# Patient Record
Sex: Male | Born: 1985 | Hispanic: Yes | Marital: Single | Smoking: Never smoker
Health system: Southern US, Community
[De-identification: ages and names within clinical notes are randomized; demographics above are authoritative.]

## PROBLEM LIST (undated history)

## (undated) HISTORY — PX: LEG SURGERY: SHX1003

---

## 2013-08-10 ENCOUNTER — Encounter (HOSPITAL_COMMUNITY): Payer: Self-pay | Admitting: Emergency Medicine

## 2013-08-10 ENCOUNTER — Emergency Department (HOSPITAL_COMMUNITY)
Admission: EM | Admit: 2013-08-10 | Discharge: 2013-08-10 | Disposition: A | Discharge status: Home / Self Care | Payer: Self-pay | Attending: Emergency Medicine | Admitting: Emergency Medicine

## 2013-08-10 DIAGNOSIS — Z79899 Other long term (current) drug therapy: Secondary | ICD-10-CM | POA: Insufficient documentation

## 2013-08-10 DIAGNOSIS — K219 Gastro-esophageal reflux disease without esophagitis: Secondary | ICD-10-CM

## 2013-08-10 DIAGNOSIS — R1013 Epigastric pain: Secondary | ICD-10-CM

## 2013-08-10 DIAGNOSIS — K3189 Other diseases of stomach and duodenum: Secondary | ICD-10-CM | POA: Insufficient documentation

## 2013-08-10 MED ORDER — GI COCKTAIL ~~LOC~~
30.0000 mL | Freq: Once | ORAL | Status: AC
  Administered 2013-08-10: 30 mL via ORAL
  Filled 2013-08-10: qty 30

## 2013-08-10 MED ORDER — PANTOPRAZOLE SODIUM 40 MG PO TBEC: 40.0000 mg | DELAYED_RELEASE_TABLET | Freq: Every day | ORAL | Status: DC

## 2013-08-10 NOTE — ED Notes (Signed)
Pt in c/o generalized abd pain x2 weeks, denies n/v/d or other symptoms, states his pain is increased at night and that he feels very full, no distress noted

## 2013-08-10 NOTE — ED Provider Notes (Signed)
CSN: [id number]     Arrival date & time 08/10/13  1729 History   First MD Initiated Contact with Patient 08/10/13 1801     Chief Complaint  Patient presents with  . Abdominal Pain   (Consider location/radiation/quality/duration/timing/severity/associated sxs/prior Treatment) HPI Comments: Pt reports pain gets worse at night when he tries to lay flat, has been sleeping propped up on pillows to ease the pain.  He admits he eats spicy things often, doesn't drink alcohol hardly.  No meds tried PTA.  Pt also reports over the past 6 months, he hasn't been able to hold his urine the whole night through, has to get up at 6 AM or so to urinate, doesn't seem to affect him during the day. 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    Patient is a 27 y.o. male presenting with abdominal pain. The history is provided by the patient.  Abdominal Pain Pain location:  Generalized Pain quality: burning and cramping   Pain radiates to:  Epigastric region and chest Pain severity:  Moderate Onset quality:  Gradual Duration:  2 weeks Timing:  Constant Progression:  Waxing and waning Chronicity:  New Context: not alcohol use, not previous surgeries, not recent travel, not retching, not sick contacts and not suspicious food intake   Worsened by:  Nothing tried Associated symptoms: no anorexia, no chills, no constipation, no diarrhea, no fever, no nausea, no shortness of breath and no vomiting     History reviewed. No pertinent past medical history. History reviewed. No pertinent past surgical history. History reviewed. No pertinent family history. History  Substance Use Topics  . Smoking status: Never Smoker   . Smokeless tobacco: Not on file  . Alcohol Use: Not on file    Review of Systems  Constitutional: Negative for fever, chills, activity change and appetite change.  HENT: Negative for trouble swallowing.   Respiratory: Negative for chest tightness and shortness of breath.    Gastrointestinal: Positive for abdominal pain. Negative for nausea, vomiting, diarrhea, constipation, blood in stool and anorexia.  Musculoskeletal: Negative for back pain.    Allergies  Review of patient's allergies indicates no known allergies.  Home Medications   Current Outpatient Rx  Name  Route  Sig  Dispense  Refill  . acetaminophen (TYLENOL) 500 MG tablet   Oral   Take 500 mg by mouth every 6 (six) hours as needed.         . pantoprazole (PROTONIX) 40 MG tablet   Oral   Take 1 tablet (40 mg total) by mouth daily.   14 tablet   0    BP 124/73  Pulse 63  Temp(Src) 98.2 F (36.8 C) (Oral)  Resp 20  Wt 149 lb (67.586 kg)  SpO2 100% Physical Exam  Nursing note and vitals reviewed. Constitutional: He is oriented to person, place, and time. He appears well-developed and well-nourished. No distress.  HENT:  Head: Normocephalic and atraumatic.  Eyes: Conjunctivae and EOM are normal. No scleral icterus.  Neck: Neck supple.  Cardiovascular: Normal rate and intact distal pulses.   Pulmonary/Chest: Effort normal. No respiratory distress.  Abdominal: Soft. Bowel sounds are normal. He exhibits no distension. There is no tenderness. There is no rebound and no guarding.  Neurological: He is alert and oriented to person, place, and time. He has normal strength. Gait normal.  Skin: Skin is warm and dry. No rash noted. He is not diaphoretic.    ED Course  Procedures (including critical  care time) Labs Review Labs Reviewed  URINALYSIS, ROUTINE W REFLEX MICROSCOPIC   Imaging Review No results found.  EKG Interpretation   None      ra sat is 100% and I interpret to be normal  8:10 PM Pt reports pain improved after GI cocktail.  Pt did not give a urine sample.  I doubt UTI, don't think UA is needed.  Will d/c home.   MDM   1. GERD (gastroesophageal reflux disease)   2. Dyspepsia    Pt by history suggestive of GERD and likely dyspepsia.  Non surgical abdomen on  exam.  Will give GI cocktail and Rx for protonix at home.  Will check UA.  I suspect pt is simply drinking more fluids in evening after work and thus has less ability to hold urine in the early AM . Pt is reassured about this.  No dysuria, testicular pain, denies discharge.      Gavin Pound. Oletta Lamas, MD 08/10/13 2010

## 2013-08-10 NOTE — ED Notes (Signed)
Pt st's he has had abd pair for about 2 weeks.  Pt denies nausea, vomiting or diarrhea.  Pt alert and oriented x's 3, skin warm and dry color appropriate.

## 2013-08-10 NOTE — Discharge Instructions (Signed)
Gastroesophageal Reflux Disease, Adult  Gastroesophageal reflux disease (GERD) happens when acid from your stomach flows up into the esophagus. When acid comes in contact with the esophagus, the acid causes soreness (inflammation) in the esophagus. Over time, GERD may create small holes (ulcers) in the lining of the esophagus.  CAUSES   · Increased body weight. This puts pressure on the stomach, making acid rise from the stomach into the esophagus.  · Smoking. This increases acid production in the stomach.  · Drinking alcohol. This causes decreased pressure in the lower esophageal sphincter (valve or ring of muscle between the esophagus and stomach), allowing acid from the stomach into the esophagus.  · Late evening meals and a full stomach. This increases pressure and acid production in the stomach.  · A malformed lower esophageal sphincter.  Sometimes, no cause is found.  SYMPTOMS   · Burning pain in the lower part of the mid-chest behind the breastbone and in the mid-stomach area. This may occur twice a week or more often.  · Trouble swallowing.  · Sore throat.  · Dry cough.  · Asthma-like symptoms including chest tightness, shortness of breath, or wheezing.  DIAGNOSIS   Your caregiver may be able to diagnose GERD based on your symptoms. In some cases, X-rays and other tests may be done to check for complications or to check the condition of your stomach and esophagus.  TREATMENT   Your caregiver may recommend over-the-counter or prescription medicines to help decrease acid production. Ask your caregiver before starting or adding any new medicines.   HOME CARE INSTRUCTIONS   · Change the factors that you can control. Ask your caregiver for guidance concerning weight loss, quitting smoking, and alcohol consumption.  · Avoid foods and drinks that make your symptoms worse, such as:  · Caffeine or alcoholic drinks.  · Chocolate.  · Peppermint or mint flavorings.  · Garlic and onions.  · Spicy foods.  · Citrus fruits,  such as oranges, lemons, or limes.  · Tomato-based foods such as sauce, chili, salsa, and pizza.  · Fried and fatty foods.  · Avoid lying down for the 3 hours prior to your bedtime or prior to taking a nap.  · Eat small, frequent meals instead of large meals.  · Wear loose-fitting clothing. Do not wear anything tight around your waist that causes pressure on your stomach.  · Raise the head of your bed 6 to 8 inches with wood blocks to help you sleep. Extra pillows will not help.  · Only take over-the-counter or prescription medicines for pain, discomfort, or fever as directed by your caregiver.  · Do not take aspirin, ibuprofen, or other nonsteroidal anti-inflammatory drugs (NSAIDs).  SEEK IMMEDIATE MEDICAL CARE IF:   · You have pain in your arms, neck, jaw, teeth, or back.  · Your pain increases or changes in intensity or duration.  · You develop nausea, vomiting, or sweating (diaphoresis).  · You develop shortness of breath, or you faint.  · Your vomit is green, yellow, black, or looks like coffee grounds or blood.  · Your stool is red, bloody, or black.  These symptoms could be signs of other problems, such as heart disease, gastric bleeding, or esophageal bleeding.  MAKE SURE YOU:   · Understand these instructions.  · Will watch your condition.  · Will get help right away if you are not doing well or get worse.  Document Released: 07/01/2005 Document Revised: 12/14/2011 Document Reviewed: 04/10/2011  [employer]® Patient   Information ©2014 [employer].

## 2015-07-11 ENCOUNTER — Ambulatory Visit (INDEPENDENT_AMBULATORY_CARE_PROVIDER_SITE_OTHER): Payer: Self-pay | Admitting: Family Medicine

## 2015-07-11 ENCOUNTER — Ambulatory Visit (INDEPENDENT_AMBULATORY_CARE_PROVIDER_SITE_OTHER): Payer: Self-pay

## 2015-07-11 ENCOUNTER — Encounter: Payer: Self-pay | Admitting: Family Medicine

## 2015-07-11 VITALS — BP 112/68 | HR 62 | Temp 98.4°F | Resp 18 | Ht 65.0 in | Wt 149.2 lb

## 2015-07-11 DIAGNOSIS — L729 Follicular cyst of the skin and subcutaneous tissue, unspecified: Secondary | ICD-10-CM

## 2015-07-11 NOTE — Patient Instructions (Signed)
We will arrange an ultrasound to make sure that the area of concern is just a cyst Assuming that this looks good I would not worry about it any longer If your ultrasound appt does not work for your schedule you can certainly call and reschedule for a better time

## 2015-07-11 NOTE — Progress Notes (Signed)
[facility] [address], [place] [address] [phone]  Date:  07/11/2015   Name:  Jeremiah Cain   DOB:  August 24, 1986   MRN:  [id number]  PCP:  No PCP Per Patient    Chief Complaint: Leg Injury   History of Present Illness:  Jeremiah Cain is a 29 y.o. very pleasant male patient who presents with the following:  Here today as a new patient.  He fell off a ladder about one year ago- landed on his right hip. He had a small bruise but it lasted a long time.  He did not have it looked at then- it seemed to be ok He is here today because he has noted a lump in the area ever since,  He has also noted that it hurts over the last few days.  Admits that his uncle recently died of cancer- he is not clear on the details of what he had, but in any case he got scared and came in He is otherwise generally in good health No other concerns today  There are no active problems to display for this patient.   History reviewed. No pertinent past medical history.  History reviewed. No pertinent past surgical history.  Social History  Substance Use Topics  . Smoking status: Never Smoker   . Smokeless tobacco: None  . Alcohol Use: None    History reviewed. No pertinent family history.  No Known Allergies  Medication list has been reviewed and updated.  Current Outpatient Prescriptions on File Prior to Visit  Medication Sig Dispense Refill  . acetaminophen (TYLENOL) 500 MG tablet Take 500 mg by mouth every 6 (six) hours as needed.    . pantoprazole (PROTONIX) 40 MG tablet Take 1 tablet (40 mg total) by mouth daily. (Patient not taking: Reported on 07/11/2015) 14 tablet 0   No current facility-administered medications on file prior to visit.    Review of Systems:  As per HPI- otherwise negative.   Physical Examination: Filed Vitals:   07/11/15 1404  BP: 112/68  Pulse: 62  Temp: 98.4 F (36.9 C)  Resp: 18   Filed Vitals:   07/11/15 1404  Height:   (1.651 m)  Weight: 149 lb 3.2 oz (67.677 kg)   Body mass index is 24.83 kg/(m^2). Ideal Body Weight: Weight in (lb) to have BMI = 25: 149.9  GEN: WDWN, NAD, Non-toxic, A & O x 3, looks well and healthy  HEENT: Atraumatic, Normocephalic. Neck supple. No masses, No LAD. Ears and Nose: No external deformity. CV: RRR, No M/G/R. No JVD. No thrill. No extra heart sounds. PULM: CTA B, no wheezes, crackles, rhonchi. No retractions. No resp. distress. No accessory muscle use. ABD: S, NT, ND. No rebound. No HSM. EXTR: No c/c/e NEURO Normal gait.  PSYCH: Normally interactive. Conversant. Not depressed or anxious appearing.  Calm demeanor.   He has what seems to be a mobile, smooth subcue cyst in the external right hip near the greater trochanter , approx 2cm in diameter.  Normal ROM of the hip, no tenderness with ROM  [facility] reading (PRIMARY) by  Dr. Patsy Lager. Right hip: negative  EXAM: DG HIP (WITH OR WITHOUT PELVIS) 2-3V RIGHT  COMPARISON: None impacts  FINDINGS: The bony pelvis is adequately mineralized. There is no evidence of an acute or old fracture. AP and frog-leg lateral views of the right hip reveal the joint space be reasonably well maintained. The femoral head, neck, and intertrochanteric regions are normal.  The soft tissues are unremarkable.  IMPRESSION: There is no acute or significant chronic bony abnormality of the pelvis or right hip.   Assessment and Plan: Cyst of subcutaneous tissue - Plan: DG HIP UNILAT W OR W/O PELVIS 2-3 VIEWS RIGHT, [place] Misc Soft Tissue  He felt reassured by x-ray but would like to go ahead and an [place] which is fine- will arrange this for him Suspect a benign cyst  Signed Abbe Amsterdam, MD

## 2015-07-19 ENCOUNTER — Other Ambulatory Visit: Payer: Self-pay | Admitting: Family Medicine

## 2015-07-19 ENCOUNTER — Ambulatory Visit
Admission: RE | Admit: 2015-07-19 | Discharge: 2015-07-19 | Disposition: A | Discharge status: Home / Self Care | Payer: No Typology Code available for payment source | Source: Ambulatory Visit | Attending: Family Medicine | Admitting: Family Medicine

## 2015-07-19 DIAGNOSIS — L729 Follicular cyst of the skin and subcutaneous tissue, unspecified: Secondary | ICD-10-CM

## 2015-07-23 ENCOUNTER — Telehealth: Payer: Self-pay

## 2015-07-23 DIAGNOSIS — R2241 Localized swelling, mass and lump, right lower limb: Secondary | ICD-10-CM

## 2015-07-23 NOTE — Telephone Encounter (Signed)
Dr. Patsy Lageropland  Patient is waiting a call back regarding his test.   [phone] (H)

## 2015-07-23 NOTE — Telephone Encounter (Signed)
Received his US report  US PELVIS LIMITED  TECHNIQUE: Ultrasound examination of the pelvic soft tissues was performed in the area of clinical concern.  COMPARISON: None.  FINDINGS: A solid-appearing lesion is seen deep to the skin surface, at the site of palpable abnormality in the right lateral superior thigh, measuring 2.5 x 0.9 x 1.8 cm. No internal blood flow. No cystic component.  IMPRESSION: Solid appearing lesion in the right lateral superior thigh, at the site of palpable abnormality  Called him back- while I still think this lesion is probably benign, it is not a cyst and we really don't know what it is.  He would like to see general surgery for their opinion which is reasonable.  I will make this referral for him- he will let us know if he does not hear about this soon

## 2015-08-05 ENCOUNTER — Ambulatory Visit: Payer: Self-pay | Admitting: Surgery

## 2015-08-05 NOTE — H&P (Signed)
  History of Present Illness Jeremiah Cain(Casmere Hollenbeck K. Otillia Cordone MD; 08/05/2015 10:12 AM) Patient words: thigh mass.  The patient is a 29 year old male who presents with a complaint of Mass. Referred by dr. 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 Several months ago he had an accident where he fell off of the latter and landed on his right buttock. He had a bruise in this area that persisted for several months. He has developed a firm mass in this area that has become slightly larger but certainly causes some significant discomfort. He underwent an ultrasound of this area.  CLINICAL DATA: Palpable area right lateral thigh with pain radiating down the leg for 2 weeks.  EXAM: US PELVIS LIMITED  TECHNIQUE: Ultrasound examination of the pelvic soft tissues was performed in the area of clinical concern.  COMPARISON: None.  FINDINGS: A solid-appearing lesion is seen deep to the skin surface, at the site of palpable abnormality in the right lateral superior thigh, measuring 2.5 x 0.9 x 1.8 cm. No internal blood flow. No cystic component.  IMPRESSION: Solid appearing lesion in the right lateral superior thigh, at the site of palpable abnormality.   Electronically Signed By: Leanna BattlesMelinda Blietz M.D. On: 07/19/2015 15:03  Due to the discomfort and the recent passing of his uncle from cancer, he would like to have this area excised.     Other Problems (Ammie Eversole, LPN; 16/10/960410/31/2016 9:56 AM) Gastroesophageal Reflux Disease  Past Surgical History (Ammie Eversole, LPN; 54/09/811910/31/2016 9:56 AM) No pertinent past surgical history  Allergies (Ammie Eversole, LPN; 14/78/295610/31/2016 9:57 AM) No Known Drug Allergies 08/05/2015  Medication History (Ammie Eversole, LPN; 21/30/865710/31/2016 9:57 AM) Tylenol (325MG  Tablet, Oral) Active. Medications Reconciled     Review of Systems (Ammie Eversole LPN; 84/69/629510/31/2016 9:56 AM) General Present-  Fatigue. Not Present- Appetite Loss, Chills, Fever, Night Sweats, Weight Gain and Weight Loss. Skin Present- New Lesions. Not Present- Change in Wart/Mole, Dryness, Hives, Jaundice, Non-Healing Wounds, Rash and Ulcer. Musculoskeletal Present- Muscle Weakness. Not Present- Back Pain, Joint Pain, Joint Stiffness, Muscle Pain and Swelling of Extremities.  Vitals (Ammie Eversole LPN; 28/41/324410/31/2016 9:57 AM) 08/05/2015 9:57 AM Weight: 154.2 lb Height: 65in Body Surface Area: 1.77 m Body Mass Index: 25.66 kg/m  Temp.: 97.28F(Oral)  Pulse: 72 (Regular)  BP: 110/72 (Sitting, Left Arm, Standard)      Physical Exam Molli Hazard(Mikala Podoll K. Morna Flud MD; 08/05/2015 10:12 AM)  The physical exam findings are as follows: Note:WDWN in NAD HEENT: EOMI, sclera anicteric Neck: No masses, no thyromegaly Lungs: CTA bilaterally; normal respiratory effort CV: Regular rate and rhythm; no murmurs Abd: +bowel sounds, soft, non-tender, no masses Right lateral buttock - no skin discoloration or changes; 2 cm firm subcutaneous mass; smooth; not mobile Ext: Well-perfused; no edema Skin: Warm, dry; no sign of jaundice    Assessment & Plan Molli Hazard(Mekiyah Gladwell K. Esiquio Boesen MD; 08/05/2015 10:07 AM)  MASS OF SUBCUTANEOUS TISSUE (R22.9) Story: 2 cm; right buttock; tender  Current Plans Schedule for Surgery - Excision of subcutaneous mass - right buttock - 2 cm; The surgical procedure has been discussed with the patient. Potential risks, benefits, alternative treatments, and expected outcomes have been explained. All of the patient's questions at this time have been answered. The likelihood of reaching the patient's treatment goal is good. The patient understand the proposed surgical procedure and wishes to proceed  Jeremiah ArmsMatthew K. Corliss Skainssuei, MD, [facility]  General/ Trauma Surgery  08/05/2015 10:13 AM

## 2017-02-08 ENCOUNTER — Encounter (HOSPITAL_COMMUNITY): Payer: Self-pay | Admitting: Vascular Surgery

## 2017-02-08 ENCOUNTER — Emergency Department (HOSPITAL_COMMUNITY)
Admission: EM | Admit: 2017-02-08 | Discharge: 2017-02-09 | Disposition: A | Discharge status: Home / Self Care | Payer: Self-pay | Attending: Emergency Medicine | Admitting: Emergency Medicine

## 2017-02-08 DIAGNOSIS — H9202 Otalgia, left ear: Secondary | ICD-10-CM | POA: Insufficient documentation

## 2017-02-08 MED ORDER — IBUPROFEN 600 MG PO TABS: 600.0000 mg | ORAL_TABLET | Freq: Three times a day (TID) | ORAL | 0 refills | Status: DC | PRN

## 2017-02-08 MED ORDER — OFLOXACIN 0.3 % OP SOLN
5.0000 [drp] | Freq: Once | OPHTHALMIC | Status: AC
  Administered 2017-02-09: 5 [drp] via OTIC
  Filled 2017-02-08: qty 5

## 2017-02-08 NOTE — ED Notes (Signed)
See provider assessment 

## 2017-02-08 NOTE — ED Provider Notes (Signed)
[facility]-EMERGENCY DEPT Provider Note   CSN: [id number] Arrival date & time: 02/08/17  2032 By signing my name below, I, Bridgette Habermann, attest that this documentation has been prepared under the direction and in the presence of Felicie Morn, FNP. Electronically Signed: Bridgette Habermann, ED Scribe. 02/08/17. 11:30 PM.  History   Chief Complaint Chief Complaint  Patient presents with  . Otalgia    HPI The history is provided by the patient. No language interpreter was used.   HPI Comments: Jeremiah Cain is a 31 y.o. male with no pertinent PMHx, who presents to the Emergency Department complaining of left ear pain beginning last night with associated mild sore throat. Pt states pain is aching in quality and rates it an 8/10. No recent injury or trauma but notes he has been outside more recently. He has not tried any OTC medications PTA. Pt denies fever, chills, nausea, vomiting, nasal congestion, rhinorrhea, cough, hearing loss, or drainage from the ear.  History reviewed. No pertinent past medical history.  There are no active problems to display for this patient.   Past Surgical History:  Procedure Laterality Date  . LEG SURGERY Right        Home Medications    Prior to Admission medications   Medication Sig Start Date End Date Taking? Authorizing Provider  acetaminophen (TYLENOL) 500 MG tablet Take 500 mg by mouth every 6 (six) hours as needed.    [provider]  pantoprazole (PROTONIX) 40 MG tablet Take 1 tablet (40 mg total) by mouth daily. Patient not taking: Reported on 07/11/2015 08/10/13   Quita Skye, MD    Family History History reviewed. No pertinent family history.  Social History Social History  Substance Use Topics  . Smoking status: Never Smoker  . Smokeless tobacco: Never Used  . Alcohol use Yes     Comment: occasionally     Allergies   Patient has no known allergies.   Review of Systems Review of Systems  Constitutional: Negative for chills and  fever.  HENT: Positive for ear pain and sore throat. Negative for congestion, ear discharge, hearing loss and rhinorrhea.   Respiratory: Negative for cough.   Gastrointestinal: Negative for nausea and vomiting.  All other systems reviewed and are negative.  Physical Exam Updated Vital Signs BP 128/85 (BP Location: Left Arm)   Pulse 73   Temp 98.7 F (37.1 C) (Oral)   Resp 16   SpO2 97%   Physical Exam  Constitutional: He appears well-developed and well-nourished.  HENT:  Head: Normocephalic.  Right Ear: Hearing, tympanic membrane and external ear normal.  Left Ear: Hearing and external ear normal.  Left tympanic erythematous with small hole noted, no associated hearing loss and no known trauma.  Eyes: Conjunctivae are normal.  Cardiovascular: Normal rate.   Pulmonary/Chest: Effort normal. No respiratory distress.  Abdominal: He exhibits no distension.  Musculoskeletal: Normal range of motion.  Neurological: He is alert.  Skin: Skin is warm and dry.  Psychiatric: He has a normal mood and affect. His behavior is normal.  Nursing note and vitals reviewed.    ED Treatments / Results  DIAGNOSTIC STUDIES: Oxygen Saturation is 97% on RA, adequate by my interpretation.   COORDINATION OF CARE: 11:30 PM-Discussed next steps with pt. Pt verbalized understanding and is agreeable with the plan.   Labs (all labs ordered are listed, but only abnormal results are displayed) Labs Reviewed - No data to display  EKG  EKG Interpretation None  Radiology No results found.  Procedures Procedures (including critical care time)  Medications Ordered in ED Medications - No data to display   Initial Impression / Assessment and Plan / ED Course  I have reviewed the triage vital signs and the nursing notes.  Pertinent labs & imaging results that were available during my care of the patient were reviewed by me and considered in my medical decision making (see chart for  details).     Pt presenting with otalgia. Pt afebrile in NAD. Exam not concerning for mastoiditis, cellulitis. Discharge with ocuflox.  Advised follow up with ENT in 2-3 days if no improvement.  Return precautions discussed. Pt appears safe for discharge.  Final Clinical Impressions(s) / ED Diagnoses   Final diagnoses:  Otalgia of left ear    New Prescriptions New Prescriptions   No medications on file   I personally performed the services described in this documentation, which was scribed in my presence. The recorded information has been reviewed and is accurate.     Felicie MornSmith, Joyous Gleghorn, NP 02/09/17 0149    Marily MemosMesner, Jason, MD 02/09/17 [phone]

## 2017-02-08 NOTE — Discharge Instructions (Signed)
Apply five drops of the ocuflox to the left ear two times per day.

## 2017-02-08 NOTE — ED Triage Notes (Signed)
Pt reports to the ED for eval of left earache x several days. Denies any drainage from the ear. Pt denies any N/V. He has been outside a lot more recently. Denies any nasal congestion/drainage or sore throat.

## 2017-02-09 NOTE — ED Notes (Signed)
Waiting on medication from main pharmacy 

## 2017-05-01 IMAGING — CR DG HIP (WITH OR WITHOUT PELVIS) 2-3V*R*
2 series · 2 of 2 positions shown · non-contrast
Comparison: None impacts

CLINICAL DATA: Status post fall from a ladder 1 year ago landing on
the right hip, initial bruising over the hip but now has developed a
lump in this region which has become painful over the past few days.

EXAM:
DG HIP (WITH OR WITHOUT PELVIS) 2-3V RIGHT

[AP]
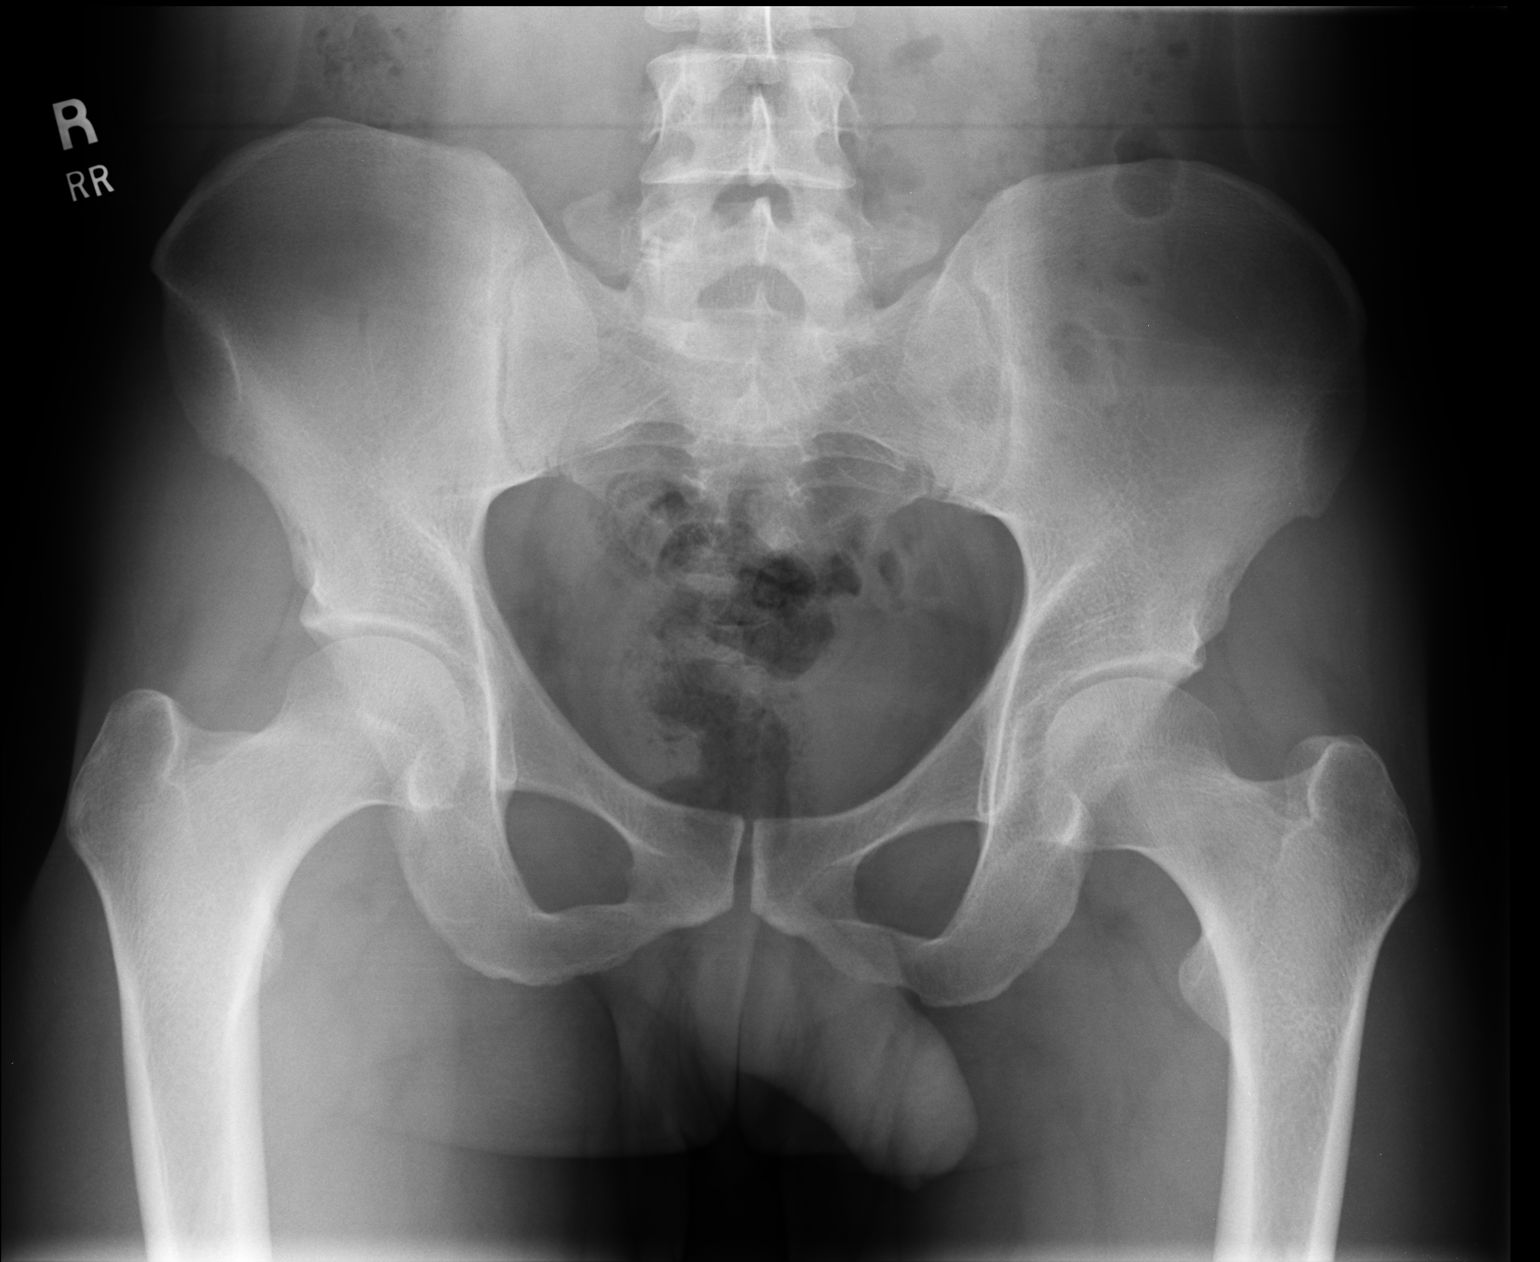

[lateral]
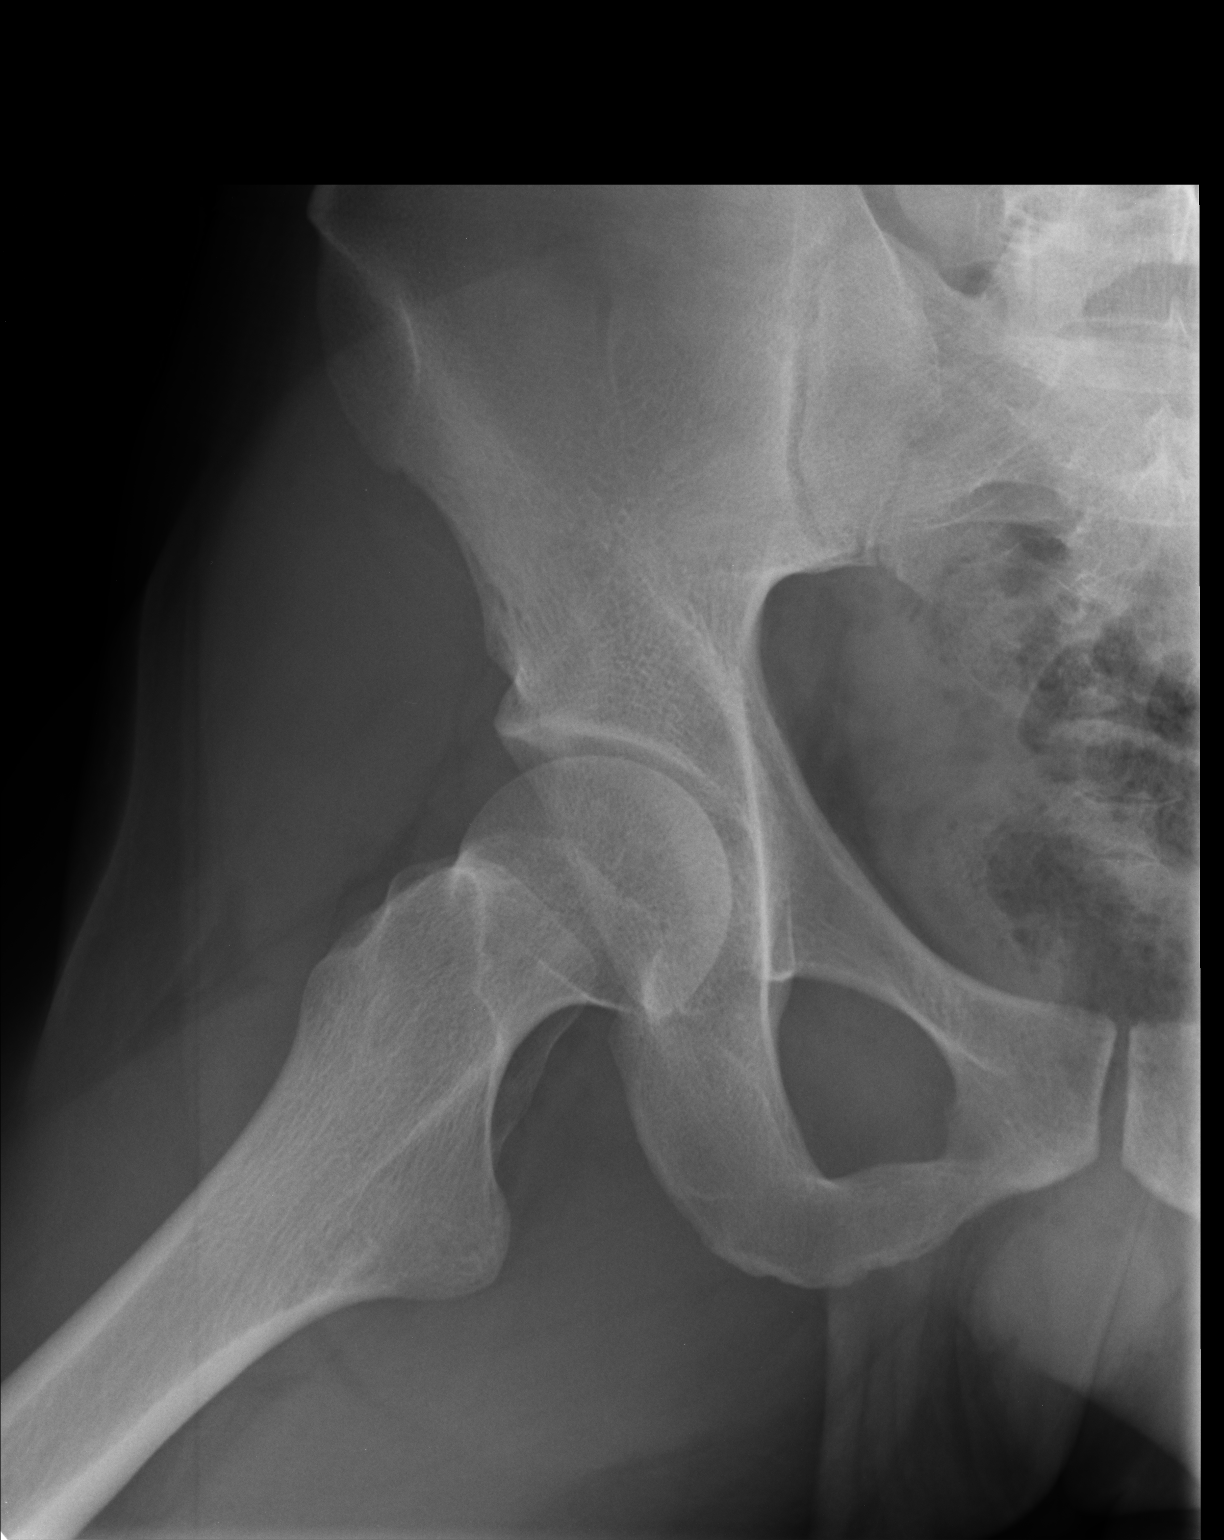

[2 of 2 positions shown; findings below may reference images not displayed]

FINDINGS: The bony pelvis is adequately mineralized. There is no evidence of
an acute or old fracture. AP and frog-leg lateral views of the right
hip reveal the joint space be reasonably well maintained. The
femoral head, neck, and intertrochanteric regions are normal. The
soft tissues are unremarkable.
IMPRESSION: There is no acute or significant chronic bony abnormality of the
pelvis or right hip.

## 2019-05-11 ENCOUNTER — Other Ambulatory Visit: Payer: Self-pay

## 2019-05-11 ENCOUNTER — Encounter (HOSPITAL_COMMUNITY): Payer: Self-pay | Admitting: Emergency Medicine

## 2019-05-11 ENCOUNTER — Ambulatory Visit (HOSPITAL_COMMUNITY)
Admission: EM | Admit: 2019-05-11 | Discharge: 2019-05-11 | Disposition: A | Discharge status: Home / Self Care | Payer: Self-pay | Attending: Urgent Care | Admitting: Urgent Care

## 2019-05-11 DIAGNOSIS — S0181XA Laceration without foreign body of other part of head, initial encounter: Secondary | ICD-10-CM

## 2019-05-11 DIAGNOSIS — S0990XA Unspecified injury of head, initial encounter: Secondary | ICD-10-CM

## 2019-05-11 DIAGNOSIS — R51 Headache: Secondary | ICD-10-CM

## 2019-05-11 DIAGNOSIS — R519 Headache, unspecified: Secondary | ICD-10-CM

## 2019-05-11 DIAGNOSIS — W268XXA Contact with other sharp object(s), not elsewhere classified, initial encounter: Secondary | ICD-10-CM

## 2019-05-11 DIAGNOSIS — Y9259 Other trade areas as the place of occurrence of the external cause: Secondary | ICD-10-CM

## 2019-05-11 DIAGNOSIS — Z23 Encounter for immunization: Secondary | ICD-10-CM

## 2019-05-11 MED ORDER — LIDOCAINE-EPINEPHRINE (PF) 2 %-1:200000 IJ SOLN
INTRAMUSCULAR | Status: AC
  Filled 2019-05-11: qty 20

## 2019-05-11 MED ORDER — NAPROXEN 500 MG PO TABS: 500.0000 mg | ORAL_TABLET | Freq: Two times a day (BID) | ORAL | 0 refills | Status: DC

## 2019-05-11 MED ORDER — TRAMADOL HCL 50 MG PO TABS: 50.0000 mg | ORAL_TABLET | Freq: Four times a day (QID) | ORAL | 0 refills | Status: DC | PRN

## 2019-05-11 MED ORDER — TETANUS-DIPHTH-ACELL PERTUSSIS 5-2.5-18.5 LF-MCG/0.5 IM SUSP
0.5000 mL | Freq: Once | INTRAMUSCULAR | Status: AC
  Administered 2019-05-11: 14:00:00 0.5 mL via INTRAMUSCULAR

## 2019-05-11 MED ORDER — TETANUS-DIPHTH-ACELL PERTUSSIS 5-2.5-18.5 LF-MCG/0.5 IM SUSP
INTRAMUSCULAR | Status: AC
  Filled 2019-05-11: qty 0.5

## 2019-05-11 MED ORDER — CEPHALEXIN 500 MG PO CAPS: 500.0000 mg | ORAL_CAPSULE | Freq: Three times a day (TID) | ORAL | 0 refills | Status: DC

## 2019-05-11 NOTE — ED Provider Notes (Addendum)
MRN: [id number] DOB: May 28, 1986  Subjective:   Arinze Rivadeneira is a 33 y.o. male presenting for suffering an acute laceration to his forehead today while at work. 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 He did not fall, have loc; no dizziness, confusion, vision changes. He has headache/pain about his wound. Cannot recall his last tdap.    No current facility-administered medications for this encounter.   Current Outpatient Medications:  .  acetaminophen (TYLENOL) 500 MG tablet, Take 500 mg by mouth every 6 (six) hours as needed., Disp: , Rfl:  .  ibuprofen (ADVIL,MOTRIN) 600 MG tablet, Take 1 tablet (600 mg total) by mouth every 8 (eight) hours as needed., Disp: 15 tablet, Rfl: 0 .  pantoprazole (PROTONIX) 40 MG tablet, Take 1 tablet (40 mg total) by mouth daily. (Patient not taking: Reported on 07/11/2015), Disp: 14 tablet, Rfl: 0    No Known Allergies   History reviewed. No pertinent past medical history.    Past Surgical History:  Procedure Laterality Date  . LEG SURGERY Right     ROS  Objective:   Vitals: BP 116/85   Pulse 60   Temp 97.9 F (36.6 C)   Resp 16   SpO2 100%   Physical Exam Constitutional:      General: He is not in acute distress.    Appearance: Normal appearance. He is well-developed. He is not ill-appearing, toxic-appearing or diaphoretic.  HENT:     Head: Normocephalic and atraumatic.      Right Ear: External ear normal.     Left Ear: External ear normal.     Nose: Nose normal.     Mouth/Throat:     Mouth: Mucous membranes are moist.     Pharynx: Oropharynx is clear.  Eyes:     General: No scleral icterus.    Extraocular Movements: Extraocular movements intact.     Pupils: Pupils are equal, round, and reactive to light.  Cardiovascular:     Rate and Rhythm: Normal rate and regular rhythm.     Heart sounds: Normal heart sounds. No murmur. No friction rub. No gallop.    Pulmonary:     Effort: Pulmonary effort is normal. No respiratory distress.     Breath sounds: Normal breath sounds. No stridor. No wheezing, rhonchi or rales.  Neurological:     General: No focal deficit present.     Mental Status: He is alert and oriented to person, place, and time.     Cranial Nerves: No cranial nerve deficit.     Motor: No weakness.     Coordination: Coordination normal.     Gait: Gait normal.     Deep Tendon Reflexes: Reflexes normal.  Psychiatric:        Mood and Affect: Mood normal.        Behavior: Behavior normal.        Thought Content: Thought content normal.        Judgment: Judgment normal.           PROCEDURE NOTE: laceration repair Verbal consent obtained from patient.  Laceration extends approximately 5 cm in total.  Local anesthesia with 8cc Lidocaine 2% with epinephrine.  Wound explored for tendon, ligament damage. Wound scrubbed with soap and water and rinsed. Wound closed with #7 5-0 Prolene and 5-0 Ethilon (combination of horizontal mattress, subcutaneous dog ear and simple interrupted) sutures.  Wound cleansed and dressed.   Assessment and Plan :  1. Injury of head, initial encounter   2. Facial laceration, initial encounter   3. Facial pain     Laceration repaired successfully. Wound care reviewed. Counseled on signs and symptoms warranting head CT for possible skull fracture, concussion although I have low suspicion of either. Return-to-clinic precautions discussed, patient verbalized understanding. Otherwise, follow up in 7 days for suture removal.    Wallis BambergMani, Aubreana Cornacchia, PA-C 05/11/19 1411    Wallis BambergMani, Jacqualine Weichel, PA-C 05/25/19 1051

## 2019-05-11 NOTE — ED Triage Notes (Signed)
Pt states he was hit in the head today with a metal frame, denies LOC. Laceration is bandaged up at this time, unable to assess.

## 2019-05-11 NOTE — ED Notes (Signed)
Patient reports a spring on equipment snapped and was hit in the forehead.  Denies loc.  Patient has a laceration to left forehead.  Continued bleeding, slow flow.  Applied dressing to forehead.  Notified traci, np about patient .  Will see patient

## 2023-03-16 ENCOUNTER — Other Ambulatory Visit: Payer: Self-pay

## 2023-03-16 ENCOUNTER — Encounter (HOSPITAL_COMMUNITY): Payer: Self-pay | Admitting: Emergency Medicine

## 2023-03-16 ENCOUNTER — Ambulatory Visit (HOSPITAL_COMMUNITY)
Admission: EM | Admit: 2023-03-16 | Discharge: 2023-03-16 | Disposition: A | Discharge status: Home / Self Care | Payer: Self-pay | Attending: Emergency Medicine | Admitting: Emergency Medicine

## 2023-03-16 DIAGNOSIS — R102 Pelvic and perineal pain: Secondary | ICD-10-CM | POA: Insufficient documentation

## 2023-03-16 DIAGNOSIS — M545 Low back pain, unspecified: Secondary | ICD-10-CM | POA: Insufficient documentation

## 2023-03-16 LAB — POCT URINALYSIS DIP (MANUAL ENTRY)
Bilirubin, UA: NEGATIVE
Glucose, UA: NEGATIVE mg/dL
Ketones, POC UA: NEGATIVE mg/dL
Leukocytes, UA: NEGATIVE
Nitrite, UA: NEGATIVE
Protein Ur, POC: NEGATIVE mg/dL
Spec Grav, UA: 1.01
Urobilinogen, UA: 0.2 U/dL
pH, UA: 7

## 2023-03-16 MED ORDER — CIPROFLOXACIN HCL 500 MG PO TABS: 500.0000 mg | ORAL_TABLET | Freq: Two times a day (BID) | ORAL | 0 refills | Status: DC

## 2023-03-16 NOTE — ED Triage Notes (Signed)
Reports abdominal pain and lower back pain for 4 days.  No history of this type of pain.  Saturday morning was vomiting, but none since then.  No issues with urination.  No diarrhea.    Patient took advil one time

## 2023-03-16 NOTE — ED Provider Notes (Signed)
[facility]    CSN: [id number] Arrival date & time: 03/16/23  1232      History   Chief Complaint Chief Complaint  Patient presents with   Abdominal Pain    HPI Jeremiah Cain is a 37 y.o. male.   Patient presents for evaluation of centralized lower back pain and centralized lower abdominal pain present for 4 days.  Pain does not radiate and still describes 2 separate pains that are occurring intermittently.  Pain today) described like, but has not had a sensation of being swelling.  Given that pain is constant again and exacerbated when lying flat.  Had 1 occurrence of vomiting 3 days ago, has resolved, has been able to tolerate both food and liquids since. has had several occurrence of urinary.. He appears to be has not attempted treatment.  Denies dysuria, hematuria, frequency, fevers, hematuria, penile discharge, testicular swelling, diarrhea, constipation, heartburn or indigestion.  Denies dietary changes or recent travel.  No other member of household has similar symptoms.  History reviewed. No pertinent past medical history.  There are no problems to display for this patient.   Past Surgical History:  Procedure Laterality Date   LEG SURGERY Right        Home Medications    Prior to Admission medications   Medication Sig Start Date End Date Taking? Authorizing Provider  acetaminophen (TYLENOL) 500 MG tablet Take 500 mg by mouth every 6 (six) hours as needed.    [provider]  cephALEXin (KEFLEX) 500 MG capsule Take 1 capsule (500 mg total) by mouth 3 (three) times daily. Patient not taking: Reported on 03/16/2023 05/11/19   Wallis Bamberg, PA-C  ibuprofen (ADVIL,MOTRIN) 600 MG tablet Take 1 tablet (600 mg total) by mouth every 8 (eight) hours as needed. 02/08/17   Felicie Morn, NP  naproxen (NAPROSYN) 500 MG tablet Take 1 tablet (500 mg total) by mouth 2 (two) times daily. Patient not taking: Reported on 03/16/2023 05/11/19   Wallis Bamberg, PA-C  pantoprazole  (PROTONIX) 40 MG tablet Take 1 tablet (40 mg total) by mouth daily. Patient not taking: Reported on 07/11/2015 08/10/13   Quita Skye, MD  traMADol (ULTRAM) 50 MG tablet Take 1 tablet (50 mg total) by mouth every 6 (six) hours as needed. Patient not taking: Reported on 03/16/2023 05/11/19   Wallis Bamberg, PA-C    Family History History reviewed. No pertinent family history.  Social History Social History   Tobacco Use   Smoking status: Never   Smokeless tobacco: Never  Vaping Use   Vaping Use: Never used  Substance Use Topics   Alcohol use: Yes    Comment: occasionally   Drug use: No     Allergies   Patient has no known allergies.   Review of Systems Review of Systems  Constitutional: Negative.   HENT: Negative.    Respiratory: Negative.    Cardiovascular: Negative.   Gastrointestinal:  Positive for abdominal pain and vomiting. Negative for abdominal distention, anal bleeding, blood in stool, constipation, diarrhea, nausea and rectal pain.  Genitourinary: Negative.      Physical Exam Triage Vital Signs ED Triage Vitals  Enc Vitals Group     BP 03/16/23 1304 115/77     Pulse Rate 03/16/23 1304 63     Resp 03/16/23 1304 16     Temp 03/16/23 1304 98.3 F (36.8 C)     Temp Source 03/16/23 1304 Oral     SpO2 03/16/23 1304 96 %  Weight --      Height --      Head Circumference --      Peak Flow --      Pain Score 03/16/23 1301 7     Pain Loc --      Pain Edu? --      Excl. in GC? --    No data found.  Updated Vital Signs BP 115/77 (BP Location: Left Arm)   Pulse 63   Temp 98.3 F (36.8 C) (Oral)   Resp 16   SpO2 96%   Visual Acuity Right Eye Distance:   Left Eye Distance:   Bilateral Distance:    Right Eye Near:   Left Eye Near:    Bilateral Near:     Physical Exam Constitutional:      Appearance: Normal appearance. He is well-developed.  Abdominal:     General: Abdomen is flat. Bowel sounds are normal.     Palpations: Abdomen is soft.      Tenderness: There is abdominal tenderness in the suprapubic area.  Musculoskeletal:     Comments: Tenderness is present to the center of the lumbar region without spinal tenderness, ecchymosis, swelling present examination, able to sit on right lateral complication, negative straight leg test  Neurological:     Mental Status: He is alert and oriented to person, place, and time. Mental status is at baseline.      UC Treatments / Results  Labs (all labs ordered are listed, but only abnormal results are displayed) Labs Reviewed - No data to display  EKG   Radiology No results found.  Procedures Procedures (including critical care time)  Medications Ordered in UC Medications - No data to display  Initial Impression / Assessment and Plan / UC Course  I have reviewed the triage vital signs and the nursing notes.  Pertinent labs & imaging results that were available during my care of the patient were reviewed by me and considered in my medical decision making (see chart for details).  Acute midline low back pain without sciatica, suprapubic abdominal pain  Unknown etiology,  vital signs are stable and patient is in no signs of distress nontoxic-appearing, low suspicion for viral infectious cause due to lack of accompanying symptoms, etiology possible prostatitis versus kidney stone, discussed this with patient, prophylactically placed on ciprofloxacin, STI labs pending, given strict precautions for worsening symptoms to go to the nearest emergency department, given walker referral to GI and urology for his symptoms continue to persist  Final Clinical Impressions(s) / UC Diagnoses   Final diagnoses:  None   Discharge Instructions   None    ED Prescriptions   None    PDMP not reviewed this encounter.   Valinda Hoar, NP 03/16/23 1350

## 2023-03-16 NOTE — Discharge Instructions (Signed)
Today you are evaluated for abdominal pain, unknown cause at this time, possible causes are kidney stones versus prostate  Urine is negative for signs of infection  STI labs are pending, you will be notified of any positive test results, low suspicion as you are in a monogamous relationship but must rule out  Begin ciprofloxacin every morning and every evening for 7 days  If you continue to have pain and all testing today is negative and there is no improvement with antibiotics please follow-up with urologist  If your abdominal pain can take a holiday urinary symptoms resolved evaluation of fever with antibiotic please follow-up with gastrointestinal doctor  At any point if your symptoms worsen in severity please go to the nearest emergency department for evaluation

## 2023-03-17 LAB — CYTOLOGY, (ORAL, ANAL, URETHRAL) ANCILLARY ONLY
Chlamydia: NEGATIVE
Comment: NEGATIVE
Comment: NEGATIVE
Comment: NORMAL
Neisseria Gonorrhea: NEGATIVE
Trichomonas: NEGATIVE

## 2024-11-06 ENCOUNTER — Ambulatory Visit (HOSPITAL_COMMUNITY)
Admission: EM | Admit: 2024-11-06 | Discharge: 2024-11-06 | Disposition: A | Discharge status: Home / Self Care | Payer: Self-pay | Source: Home / Self Care | Attending: Family Medicine | Admitting: Family Medicine

## 2024-11-06 ENCOUNTER — Other Ambulatory Visit: Payer: Self-pay

## 2024-11-06 ENCOUNTER — Encounter (HOSPITAL_COMMUNITY): Payer: Self-pay | Admitting: *Deleted

## 2024-11-06 DIAGNOSIS — R1013 Epigastric pain: Secondary | ICD-10-CM

## 2024-11-06 LAB — CBC
HCT: 44.7 % (ref 39.0–52.0)
Hemoglobin: 15.6 g/dL (ref 13.0–17.0)
MCH: 31.3 pg (ref 26.0–34.0)
MCHC: 34.9 g/dL (ref 30.0–36.0)
MCV: 89.6 fL (ref 80.0–100.0)
Platelets: 279 10*3/uL (ref 150–400)
RBC: 4.99 MIL/uL (ref 4.22–5.81)
RDW: 12.1 % (ref 11.5–15.5)
WBC: 5.5 10*3/uL (ref 4.0–10.5)
nRBC: 0 % (ref 0.0–0.2)

## 2024-11-06 LAB — COMPREHENSIVE METABOLIC PANEL WITH GFR
ALT: 48 U/L — ABNORMAL HIGH (ref 0–44)
AST: 34 U/L (ref 15–41)
Albumin: 4.3 g/dL (ref 3.5–5.0)
Alkaline Phosphatase: 111 U/L (ref 38–126)
Anion gap: 11 (ref 5–15)
BUN: 15 mg/dL (ref 6–20)
CO2: 24 mmol/L (ref 22–32)
Calcium: 9.1 mg/dL (ref 8.9–10.3)
Chloride: 104 mmol/L (ref 98–111)
Creatinine, Ser: 0.89 mg/dL (ref 0.61–1.24)
GFR, Estimated: >60 mL/min
Glucose, Bld: 105 mg/dL — ABNORMAL HIGH (ref 70–99)
Potassium: 4.1 mmol/L (ref 3.5–5.1)
Sodium: 139 mmol/L (ref 135–145)
Total Bilirubin: 0.3 mg/dL (ref 0.0–1.2)
Total Protein: 7.6 g/dL (ref 6.5–8.1)

## 2024-11-06 MED ORDER — OMEPRAZOLE 40 MG PO CPDR: 40.0000 mg | DELAYED_RELEASE_CAPSULE | Freq: Every day | ORAL | 1 refills | Status: AC

## 2024-11-06 NOTE — ED Triage Notes (Signed)
 PT reports ABD pain since last week. Pt denies nausea,vomiting or diarrhea. Pt reports he does have GERD. PT is not taking any meds for GERD

## 2024-11-06 NOTE — ED Provider Notes (Signed)
 " [facility]    CSN: [id number] Arrival date & time: 11/06/24  1432      History   Chief Complaint Chief Complaint  Patient presents with   Abdominal Pain    HPI Jeremiah Cain is a 39 y.o. male.    Abdominal Pain  Here for burning epigastric pain that began in the last week.  No nausea or vomiting.  No diarrhea.  Bowel movements are normal in frequency.  They might be black sometimes.  He admits to a little bit of dizziness and feeling weak.  He has had some cold symptoms in the last 2 to 3 days, but no fever and no malaise otherwise   He has a history of reflux which usually bothers him with burning in his chest.  He is not taking medications at present.  NKDA  He does not have primary care History reviewed. No pertinent past medical history.  There are no active problems to display for this patient.   Past Surgical History:  Procedure Laterality Date   LEG SURGERY Right        Home Medications    Prior to Admission medications  Medication Sig Start Date End Date Taking? Authorizing Provider  omeprazole  (PRILOSEC) 40 MG capsule Take 1 capsule (40 mg total) by mouth daily. 11/06/24  Yes Vonna Sharlet POUR, MD    Family History History reviewed. No pertinent family history.  Social History Social History[1]   Allergies   Patient has no known allergies.   Review of Systems Review of Systems  Gastrointestinal:  Positive for abdominal pain.     Physical Exam Triage Vital Signs ED Triage Vitals  Encounter Vitals Group     BP 11/06/24 1525 122/78     Girls Systolic BP Percentile --      Girls Diastolic BP Percentile --      Boys Systolic BP Percentile --      Boys Diastolic BP Percentile --      Pulse Rate 11/06/24 1525 71     Resp --      Temp 11/06/24 1525 98 F (36.7 C)     Temp Source 11/06/24 1525 Oral     SpO2 11/06/24 1525 98 %     Weight --      Height --      Head Circumference --      Peak Flow --      Pain Score  11/06/24 1527 4     Pain Loc --      Pain Education --      Exclude from Growth Chart --    No data found.  Updated Vital Signs BP 122/78 (BP Location: Right Arm)   Pulse 71   Temp 98 F (36.7 C) (Oral)   SpO2 98%   Visual Acuity Right Eye Distance:   Left Eye Distance:   Bilateral Distance:    Right Eye Near:   Left Eye Near:    Bilateral Near:     Physical Exam Vitals reviewed.  Constitutional:      General: He is not in acute distress.    Appearance: He is not ill-appearing, toxic-appearing or diaphoretic.  HENT:     Nose: Nose normal.     Mouth/Throat:     Mouth: Mucous membranes are moist.     Pharynx: No oropharyngeal exudate or posterior oropharyngeal erythema.  Eyes:     Extraocular Movements: Extraocular movements intact.     Conjunctiva/sclera: Conjunctivae normal.  Pupils: Pupils are equal, round, and reactive to light.  Cardiovascular:     Rate and Rhythm: Normal rate and regular rhythm.     Heart sounds: No murmur heard. Pulmonary:     Effort: Pulmonary effort is normal.     Breath sounds: Normal breath sounds.  Abdominal:     General: Bowel sounds are normal. There is no distension.     Palpations: Abdomen is soft. There is no mass.     Tenderness: There is abdominal tenderness (mild epigastrium). There is no guarding.  Musculoskeletal:     Cervical back: Neck supple.  Lymphadenopathy:     Cervical: No cervical adenopathy.  Skin:    Capillary Refill: Capillary refill takes less than 2 seconds.     Coloration: Skin is not jaundiced or pale.  Neurological:     General: No focal deficit present.     Mental Status: He is alert and oriented to person, place, and time.  Psychiatric:        Behavior: Behavior normal.      UC Treatments / Results  Labs (all labs ordered are listed, but only abnormal results are displayed) Labs Reviewed  CBC  COMPREHENSIVE METABOLIC PANEL WITH GFR    EKG   Radiology No results  found.  Procedures Procedures (including critical care time)  Medications Ordered in UC Medications - No data to display  Initial Impression / Assessment and Plan / UC Course  I have reviewed the triage vital signs and the nursing notes.  Pertinent labs & imaging results that were available during my care of the patient were reviewed by me and considered in my medical decision making (see chart for details).     Omeprazole  sent in for his gastritis versus reflux.  CBC and CMP are drawn today as he felt dizzy or weak.  He is given instructions on how to set up primary care appointment.  If he worsens he is to go to the emergency room.  And I also gave him contact information for gastroenterology Final Clinical Impressions(s) / UC Diagnoses   Final diagnoses:  Abdominal pain, epigastric     Discharge Instructions      Take omeprazole  40 mg--1 capsule daily for stomach acid.  You can also take Maalox or Mylanta liquid as needed for the burning stomach pain.  Stick with Tylenol as needed for aches or fever, as ibuprofen  and naproxen  can irritate your stomach.  Avoid acidic or spicy foods.  We have drawn blood to check your blood counts and your kidney and liver function.  Staff will notify you if there is anything significantly abnormal  You can go to the website [email or web address] to schedule yourself a new patient appointment with primary care  If you worsen in any way, please go to the emergency room       ED Prescriptions     Medication Sig Dispense Auth. Provider   omeprazole  (PRILOSEC) 40 MG capsule Take 1 capsule (40 mg total) by mouth daily. 30 capsule Arelyn Gauer K, MD      PDMP not reviewed this encounter.    [1]  Social History Tobacco Use   Smoking status: Never   Smokeless tobacco: Never  Vaping Use   Vaping status: Never Used  Substance Use Topics   Alcohol use: Yes    Comment: occasionally   Drug use: No     Vonna Sharlet POUR, MD 11/06/24 1556  "

## 2024-11-07 ENCOUNTER — Ambulatory Visit (HOSPITAL_COMMUNITY): Payer: Self-pay | Admitting: Family Medicine

## 2024-11-17 ENCOUNTER — Ambulatory Visit: Payer: Self-pay | Admitting: Gastroenterology
